# Patient Record
Sex: Male | Born: 2000
Health system: Southern US, Community
[De-identification: ages and names within clinical notes are randomized; demographics above are authoritative.]

## PROBLEM LIST (undated history)

## (undated) HISTORY — PX: KNEE SURGERY: SHX244

## (undated) HISTORY — PX: NO PAST SURGERIES: SHX2092

---
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
## 2020-11-23 ENCOUNTER — Other Ambulatory Visit: Payer: Self-pay

## 2020-11-23 ENCOUNTER — Ambulatory Visit
Admission: RE | Admit: 2020-11-23 | Discharge: 2020-11-23 | Disposition: A | Discharge status: Home / Self Care | Payer: BC Managed Care – PPO | Source: Ambulatory Visit | Attending: Family Medicine | Admitting: Family Medicine

## 2020-11-23 VITALS — BP 146/93 | HR 60 | Temp 98.1°F | Resp 16 | Ht 74.0 in | Wt 325.0 lb

## 2020-11-23 DIAGNOSIS — W57XXXA Bitten or stung by nonvenomous insect and other nonvenomous arthropods, initial encounter: Secondary | ICD-10-CM | POA: Diagnosis not present

## 2020-11-23 DIAGNOSIS — R509 Fever, unspecified: Secondary | ICD-10-CM | POA: Diagnosis not present

## 2020-11-23 LAB — COMPREHENSIVE METABOLIC PANEL
ALT: 38 U/L (ref 0–44)
AST: 35 U/L (ref 15–41)
Albumin: 4.2 g/dL (ref 3.5–5.0)
Alkaline Phosphatase: 114 U/L (ref 38–126)
Anion gap: 7 (ref 5–15)
BUN: 14 mg/dL (ref 6–20)
CO2: 26 mmol/L (ref 22–32)
Calcium: 9.1 mg/dL (ref 8.9–10.3)
Chloride: 105 mmol/L (ref 98–111)
Creatinine, Ser: 0.96 mg/dL (ref 0.61–1.24)
GFR, Estimated: >60 mL/min (ref >60)
Glucose, Bld: 94 mg/dL (ref 70–99)
Potassium: 4.4 mmol/L (ref 3.5–5.1)
Sodium: 138 mmol/L (ref 135–145)
Total Bilirubin: 1.5 mg/dL — ABNORMAL HIGH (ref 0.3–1.2)
Total Protein: 7 g/dL (ref 6.5–8.1)

## 2020-11-23 LAB — CBC WITH DIFFERENTIAL/PLATELET
Abs Immature Granulocytes: 0.02 10*3/uL (ref 0.00–0.07)
Basophils Absolute: 0 10*3/uL (ref 0.0–0.1)
Basophils Relative: 1 %
Eosinophils Absolute: 0.2 10*3/uL (ref 0.0–0.5)
Eosinophils Relative: 6 %
HCT: 44 % (ref 39.0–52.0)
Hemoglobin: 15 g/dL (ref 13.0–17.0)
Immature Granulocytes: 1 %
Lymphocytes Relative: 38 %
Lymphs Abs: 1.3 10*3/uL (ref 0.7–4.0)
MCH: 28 pg (ref 26.0–34.0)
MCHC: 34.1 g/dL (ref 30.0–36.0)
MCV: 82.2 fL (ref 80.0–100.0)
Monocytes Absolute: 0.4 10*3/uL (ref 0.1–1.0)
Monocytes Relative: 12 %
Neutro Abs: 1.5 10*3/uL — ABNORMAL LOW (ref 1.7–7.7)
Neutrophils Relative %: 42 %
Platelets: 183 10*3/uL (ref 150–400)
RBC: 5.35 MIL/uL (ref 4.22–5.81)
RDW: 13.3 % (ref 11.5–15.5)
Smear Review: NORMAL
WBC: 3.5 10*3/uL — ABNORMAL LOW (ref 4.0–10.5)
nRBC: 0 % (ref 0.0–0.2)

## 2020-11-23 MED ORDER — DOXYCYCLINE HYCLATE 100 MG PO CAPS: 100.0000 mg | ORAL_CAPSULE | Freq: Two times a day (BID) | ORAL | 0 refills | Status: AC

## 2020-11-23 NOTE — Discharge Instructions (Addendum)
Medication as prescribed.  Awaiting lab results.  Take care  Dr. Adriana Simas

## 2020-11-23 NOTE — ED Triage Notes (Signed)
Patient states that he had fever a week ago and ran another fever this past Thursday.  Patient reports lower back pain that started this past Thursday.  Patient denies any cold symptoms. Patient states that he had a tick bite this past Tuesday.

## 2020-11-23 NOTE — ED Provider Notes (Signed)
[facility]    CSN: [id number] Arrival date & time: 11/23/20  0847  History   Chief Complaint Chief Complaint  Patient presents with  . Back Pain  . Fever   HPI  20 year old male presents with the above complaints.  Patient states that last Tuesday he suffered a tick bite.  He states that on Thursday he developed fever, T-max 100.2.  He reports associated back pain.  Responded to Motrin.  Patient states that he was doing okay and then developed a fever again this past Thursday and yesterday.  He reports fever was as high as 101.  He reports that he has had some joint pain and back pain associated with a fever.  Denies GI symptoms.  Denies respiratory symptoms.  Denies urinary symptoms.  He currently states that he feels okay but feels slightly weak.  Pain 2/10 in severity.  His fever and discomfort has responded well to Motrin.  No other complaints at this time.   Home Medications    Prior to Admission medications   Medication Sig Start Date End Date Taking? Authorizing Provider  doxycycline (VIBRAMYCIN) 100 MG capsule Take 1 capsule (100 mg total) by mouth 2 (two) times daily. 11/23/20  Yes Tommie Sams, DO   Social History Social History   Tobacco Use  . Smoking status: Never Smoker  . Smokeless tobacco: Never Used  Vaping Use  . Vaping Use: Former  Substance Use Topics  . Alcohol use: Not Currently  . Drug use: Not Currently     Allergies   Patient has no known allergies.   Review of Systems Review of Systems  Constitutional: Positive for fever.  Gastrointestinal: Negative.   Genitourinary: Negative.   Musculoskeletal:       Back pain, joint pain.   Physical Exam Triage Vital Signs ED Triage Vitals  Enc Vitals Group     BP 11/23/20 0903 (!) 146/93     Pulse Rate 11/23/20 0903 60     Resp 11/23/20 0903 16     Temp 11/23/20 0903 98.1 F (36.7 C)     Temp Source 11/23/20 0903 Oral     SpO2 11/23/20 0903 100 %     Weight 11/23/20 0900 (!) 325  lb (147.4 kg)     Height 11/23/20 0900 6\' 2"  (1.88 m)     Head Circumference --      Peak Flow --      Pain Score 11/23/20 0900 2     Pain Loc --      Pain Edu? --      Excl. in GC? --    Updated Vital Signs BP (!) 146/93 (BP Location: Left Arm)   Pulse 60   Temp 98.1 F (36.7 C) (Oral)   Resp 16   Ht 6\' 2"  (1.88 m)   Wt (!) 147.4 kg   SpO2 100%   BMI 41.73 kg/m   Visual Acuity Right Eye Distance:   Left Eye Distance:   Bilateral Distance:    Right Eye Near:   Left Eye Near:    Bilateral Near:     Physical Exam Vitals and nursing note reviewed.  Constitutional:      General: He is not in acute distress.    Appearance: Normal appearance. He is obese. He is not ill-appearing.  HENT:     Head: Normocephalic and atraumatic.  Eyes:     General:        Right eye: No discharge.  Left eye: No discharge.     Conjunctiva/sclera: Conjunctivae normal.  Cardiovascular:     Rate and Rhythm: Normal rate and regular rhythm.  Pulmonary:     Effort: Pulmonary effort is normal.     Breath sounds: Normal breath sounds. No wheezing, rhonchi or rales.  Abdominal:     General: There is no distension.     Palpations: Abdomen is soft.     Tenderness: There is no abdominal tenderness.  Skin:    General: Skin is warm.     Findings: No rash.  Neurological:     Mental Status: He is alert.  Psychiatric:        Mood and Affect: Mood normal.        Behavior: Behavior normal.    UC Treatments / Results  Labs (all labs ordered are listed, but only abnormal results are displayed) Labs Reviewed  CBC WITH DIFFERENTIAL/PLATELET - Abnormal; Notable for the following components:      Result Value   WBC 3.5 (*)    All other components within normal limits  COMPREHENSIVE METABOLIC PANEL  ROCKY MTN SPOTTED FVR ABS PNL(IGG+IGM)  LYME DISEASE, WESTERN BLOT    EKG   Radiology No results found.  Procedures Procedures (including critical care time)  Medications Ordered in  UC Medications - No data to display  Initial Impression / Assessment and Plan / UC Course  I have reviewed the triage vital signs and the nursing notes.  Pertinent labs & imaging results that were available during my care of the patient were reviewed by me and considered in my medical decision making (see chart for details).    20 year old male presents with fever, body aches/joint pain.  No respiratory symptoms.  No GI symptoms.  He does note a preceding tick bite.  Laboratory studies obtained.  Awaiting results.  Placing empirically on doxycycline to cover for tickborne illness.  Final Clinical Impressions(s) / UC Diagnoses   Final diagnoses:  Fever, unspecified fever cause  Tick bite, unspecified site, initial encounter     Discharge Instructions     Medication as prescribed.  Awaiting lab results.  Take care  Dr. Adriana Simas    ED Prescriptions    Medication Sig Dispense Auth. Provider   doxycycline (VIBRAMYCIN) 100 MG capsule Take 1 capsule (100 mg total) by mouth 2 (two) times daily. 20 capsule Tommie Sams, DO     PDMP not reviewed this encounter.   Everlene Other [place], [place] 11/23/20 [phone]

## 2020-11-27 LAB — ROCKY MTN SPOTTED FVR ABS PNL(IGG+IGM)
RMSF IgG: NEGATIVE
RMSF IgM: 0.45 {index} (ref 0.00–0.89)

## 2020-12-03 LAB — LYME DISEASE, WESTERN BLOT
IgG P18 Ab.: ABSENT
IgG P23 Ab.: ABSENT
IgG P28 Ab.: ABSENT
IgG P30 Ab.: ABSENT
IgG P39 Ab.: ABSENT
IgG P45 Ab.: ABSENT
IgG P58 Ab.: ABSENT
IgG P66 Ab.: ABSENT
IgG P93 Ab.: ABSENT
IgM P23 Ab.: ABSENT
IgM P39 Ab.: ABSENT
IgM P41 Ab.: ABSENT
Lyme IgG Wb: NEGATIVE
Lyme IgM Wb: NEGATIVE
